# Patient Record
Sex: Female | Born: 1945 | Race: White | Hispanic: No | Marital: Married | Smoking: Never smoker
Health system: Southern US, Community
[De-identification: ages and names within clinical notes are randomized; demographics above are authoritative.]

## PROBLEM LIST (undated history)

## (undated) DIAGNOSIS — C4491 Basal cell carcinoma of skin, unspecified: Secondary | ICD-10-CM

## (undated) DIAGNOSIS — S069XAA Unspecified intracranial injury with loss of consciousness status unknown, initial encounter: Secondary | ICD-10-CM

## (undated) DIAGNOSIS — S069X9A Unspecified intracranial injury with loss of consciousness of unspecified duration, initial encounter: Secondary | ICD-10-CM

## (undated) HISTORY — PX: BREAST CYST EXCISION: SHX579

---

## 2007-01-14 DIAGNOSIS — C4491 Basal cell carcinoma of skin, unspecified: Secondary | ICD-10-CM

## 2007-01-14 HISTORY — DX: Basal cell carcinoma of skin, unspecified: C44.91

## 2018-03-12 ENCOUNTER — Other Ambulatory Visit: Payer: Self-pay

## 2018-03-12 ENCOUNTER — Emergency Department (HOSPITAL_COMMUNITY): Payer: Medicare Other

## 2018-03-12 ENCOUNTER — Encounter (HOSPITAL_COMMUNITY): Payer: Self-pay

## 2018-03-12 ENCOUNTER — Emergency Department (HOSPITAL_COMMUNITY)
Admission: EM | Admit: 2018-03-12 | Discharge: 2018-03-13 | Disposition: A | Discharge status: Home / Self Care | Payer: Medicare Other | Attending: Emergency Medicine | Admitting: Emergency Medicine

## 2018-03-12 DIAGNOSIS — H539 Unspecified visual disturbance: Secondary | ICD-10-CM

## 2018-03-12 DIAGNOSIS — H47292 Other optic atrophy, left eye: Secondary | ICD-10-CM | POA: Insufficient documentation

## 2018-03-12 DIAGNOSIS — H538 Other visual disturbances: Secondary | ICD-10-CM | POA: Diagnosis present

## 2018-03-12 DIAGNOSIS — Z79899 Other long term (current) drug therapy: Secondary | ICD-10-CM | POA: Diagnosis not present

## 2018-03-12 HISTORY — DX: Basal cell carcinoma of skin, unspecified: C44.91

## 2018-03-12 HISTORY — DX: Unspecified intracranial injury with loss of consciousness of unspecified duration, initial encounter: S06.9X9A

## 2018-03-12 HISTORY — DX: Unspecified intracranial injury with loss of consciousness status unknown, initial encounter: S06.9XAA

## 2018-03-12 LAB — COMPREHENSIVE METABOLIC PANEL
ALT: 19 U/L (ref 0–44)
ANION GAP: 12 (ref 5–15)
AST: 20 U/L (ref 15–41)
Albumin: 4.3 g/dL (ref 3.5–5.0)
Alkaline Phosphatase: 53 U/L (ref 38–126)
BUN: 18 mg/dL (ref 8–23)
CO2: 22 mmol/L (ref 22–32)
Calcium: 9.4 mg/dL (ref 8.9–10.3)
Chloride: 104 mmol/L (ref 98–111)
Creatinine, Ser: 0.6 mg/dL (ref 0.44–1.00)
GFR calc Af Amer: >60 mL/min (ref >60)
GFR calc non Af Amer: >60 mL/min (ref >60)
Glucose, Bld: 97 mg/dL (ref 70–99)
Potassium: 3.9 mmol/L (ref 3.5–5.1)
Sodium: 138 mmol/L (ref 135–145)
Total Bilirubin: 0.7 mg/dL (ref 0.3–1.2)
Total Protein: 7.5 g/dL (ref 6.5–8.1)

## 2018-03-12 LAB — CBC WITH DIFFERENTIAL/PLATELET
Abs Immature Granulocytes: 0.03 10*3/uL (ref 0.00–0.07)
BASOS ABS: 0.1 10*3/uL (ref 0.0–0.1)
Basophils Relative: 1 %
Eosinophils Absolute: 0.1 10*3/uL (ref 0.0–0.5)
Eosinophils Relative: 2 %
HCT: 43.6 % (ref 36.0–46.0)
Hemoglobin: 13.9 g/dL (ref 12.0–15.0)
IMMATURE GRANULOCYTES: 1 %
LYMPHS PCT: 38 %
Lymphs Abs: 2.5 10*3/uL (ref 0.7–4.0)
MCH: 29.6 pg (ref 26.0–34.0)
MCHC: 31.9 g/dL (ref 30.0–36.0)
MCV: 93 fL (ref 80.0–100.0)
Monocytes Absolute: 0.8 10*3/uL (ref 0.1–1.0)
Monocytes Relative: 11 %
Neutro Abs: 3.1 10*3/uL (ref 1.7–7.7)
Neutrophils Relative %: 47 %
Platelets: 285 10*3/uL (ref 150–400)
RBC: 4.69 MIL/uL (ref 3.87–5.11)
RDW: 13.1 % (ref 11.5–15.5)
WBC: 6.6 10*3/uL (ref 4.0–10.5)
nRBC: 0 % (ref 0.0–0.2)

## 2018-03-12 LAB — SEDIMENTATION RATE: Sed Rate: 21 mm/hr (ref 0–22)

## 2018-03-12 LAB — C-REACTIVE PROTEIN

## 2018-03-12 MED ORDER — LORAZEPAM 2 MG/ML IJ SOLN
1.0000 mg | Freq: Once | INTRAMUSCULAR | Status: AC
  Administered 2018-03-12: 1 mg via INTRAVENOUS
  Filled 2018-03-12: qty 1

## 2018-03-12 MED ORDER — GADOBUTROL 1 MMOL/ML IV SOLN
5.0000 mL | Freq: Once | INTRAVENOUS | Status: AC | PRN
  Administered 2018-03-12: 5 mL via INTRAVENOUS

## 2018-03-12 MED ORDER — LORAZEPAM 2 MG/ML IJ SOLN
0.5000 mg | Freq: Once | INTRAMUSCULAR | Status: AC
  Administered 2018-03-12: 0.5 mg via INTRAVENOUS
  Filled 2018-03-12: qty 1

## 2018-03-12 NOTE — ED Provider Notes (Signed)
[facility] Provider Note   CSN: [id number] Arrival date & time: 03/12/18  1733    History   Chief Complaint Chief Complaint  Patient presents with  . Eye Problem    HPI Norma Stein is a 73 y.o. female.  She is brought in by her husband for evaluation of an abnormal exam today at her optometrist.  She was getting a routine eye exam because she felt that her vision had diminished somewhat over the last year.  At that time the optometrist did notice that her optic disc on the left was small and pale.  Her visual acuity was still 20/20.  They sent her here to the emergency department to get a stat MRI.  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  She had a minor head injury few days ago when a window fell onto her head.  No loss of consciousness.  She does get migraines at times and she sometimes has some visual symptoms with it.  No current headache no current visual symptoms     The history is provided by the patient.  Eye Problem  Location:  Left eye Quality:  Unable to specify Severity:  Mild Onset quality:  Unable to specify Timing:  Intermittent Progression:  Unchanged Chronicity:  New Worsened by:  Nothing Ineffective treatments:  None tried Associated symptoms: blurred vision (intermittent at times) and headaches   Associated symptoms: no discharge, no double vision, no facial rash, no photophobia, no redness, no swelling and no tearing     Past Medical History:  Diagnosis Date  . Basal cell carcinoma 2009   upper lip  . Traumatic brain injury ([place])     There are no active problems to display for this patient.   History reviewed. No pertinent surgical history.   OB History   No obstetric history on file.      Home Medications    Prior to Admission medications   Medication Sig Start Date End Date Taking? Authorizing Provider    acetaminophen (TYLENOL) 500 MG tablet Take 500-1,000 mg by mouth every 6 (six) hours as needed (for pain or headaches).   Yes [provider]  Ascorbic Acid (VITAMIN C PO) Take 1 tablet by mouth daily with breakfast.   Yes [provider]  Cholecalciferol (VITAMIN D-3 PO) Take 1 capsule by mouth daily with breakfast.   Yes [provider]  COENZYME Q-10 PO Take 1 capsule by mouth at bedtime.   Yes [provider]  Flaxseed, Linseed, (FLAX SEED OIL PO) Take 1 capsule by mouth daily with breakfast.   Yes [provider]  fluticasone (FLONASE) 50 MCG/ACT nasal spray Place 1-2 sprays into both nostrils daily as needed for allergies or rhinitis.  12/31/15  Yes [provider]  Glucos-Chondroit-Hyaluron-MSM (GLUCOSAMINE CHONDROITIN JOINT PO) Take 1 tablet by mouth 2 (two) times daily.   Yes [provider]  liothyronine (CYTOMEL) 25 MCG tablet Take 25 mcg by mouth See admin instructions. Take 25 mcg by mouth 30 minutes prior to breakfast 01/26/18  Yes [provider]  liothyronine (CYTOMEL) 5 MCG tablet Take 5 mcg by mouth See admin instructions. Take 5 mcg by mouth 30 minutes prior to breakfast 03/03/17  Yes [provider]  Melatonin 3 MG TABS Take 3 mg by mouth at bedtime.   Yes [provider]  Omega-3 Fatty Acids (OMEGA-3  PO) Take 1 capsule by mouth at bedtime.   Yes [provider]  SELENIUM PO Take 1 tablet by mouth 2 (two) times daily.   Yes [provider]    Family History History reviewed. No pertinent family history.  Social History Social History   Tobacco Use  . Smoking status: Never Smoker  Substance Use Topics  . Alcohol use: Never    Frequency: Never  . Drug use: Never     Allergies   Propoxyphene; Amoxicillin; Codeine; Erythromycin; Hydrocodone-acetaminophen; Sulfa antibiotics; and Sulfamethoxazole-trimethoprim   Review of Systems Review of Systems   Constitutional: Negative for fever.  HENT: Negative for sore throat.   Eyes: Positive for blurred vision (intermittent at times) and visual disturbance. Negative for double vision, photophobia, pain, discharge and redness.  Respiratory: Negative for shortness of breath.   Cardiovascular: Negative for chest pain.  Gastrointestinal: Negative for abdominal pain.  Genitourinary: Negative for dysuria.  Musculoskeletal: Negative for neck pain.  Skin: Negative for rash.  Neurological: Positive for headaches.     Physical Exam Updated Vital Signs BP (!) 150/64 (BP Location: Right Arm)   Pulse 87   Temp 98 F (36.7 C) (Oral)   Resp 15   SpO2 100%   Physical Exam Vitals signs and nursing note reviewed.  Constitutional:      General: She is not in acute distress.    Appearance: She is well-developed.  HENT:     Head: Normocephalic and atraumatic.  Eyes:     General: Lids are normal.     Extraocular Movements: Extraocular movements intact.     Conjunctiva/sclera: Conjunctivae normal.     Right eye: Right conjunctiva is not injected.     Left eye: Left conjunctiva is not injected.     Pupils: Pupils are equal, round, and reactive to light.     Funduscopic exam:    Right eye: No hemorrhage or exudate.        Left eye: No hemorrhage or exudate.     Slit lamp exam:    Right eye: Anterior chamber quiet.     Left eye: Anterior chamber quiet.  Neck:     Musculoskeletal: Neck supple.  Cardiovascular:     Rate and Rhythm: Normal rate and regular rhythm.     Heart sounds: No murmur.  Pulmonary:     Effort: Pulmonary effort is normal. No respiratory distress.     Breath sounds: Normal breath sounds.  Abdominal:     Palpations: Abdomen is soft.     Tenderness: There is no abdominal tenderness.  Musculoskeletal: Normal range of motion.     Right lower leg: No edema.     Left lower leg: No edema.  Skin:    General: Skin is warm and dry.     Capillary Refill: Capillary refill takes  less than 2 seconds.  Neurological:     General: No focal deficit present.     Mental Status: She is alert and oriented to person, place, and time.     Cranial Nerves: No cranial nerve deficit.     Sensory: No sensory deficit.     Motor: No weakness.     Gait: Gait normal.      ED Treatments / Results  Labs (all labs ordered are listed, but only abnormal results are displayed) Labs Reviewed  COMPREHENSIVE METABOLIC PANEL  CBC WITH DIFFERENTIAL/PLATELET  SEDIMENTATION RATE  C-REACTIVE PROTEIN    EKG None  Radiology Mr Jeri Cos And Wo Contrast  Result Date: 03/13/2018 CLINICAL DATA:  Initial evaluation for left optic nerve pallor. EXAM: MRI HEAD AND ORBITS WITHOUT AND WITH CONTRAST TECHNIQUE: Multiplanar, multiecho pulse sequences of the brain and surrounding structures were obtained without and with intravenous contrast. Multiplanar, multiecho pulse sequences of the orbits and surrounding structures were obtained including fat saturation techniques, before and after intravenous contrast administration. CONTRAST:  5 cc of Gadavist. COMPARISON:  None available. FINDINGS: MRI HEAD FINDINGS Brain: Cerebral volume within normal limits for age. No focal parenchymal signal abnormality identified. No significant cerebral white matter changes. No abnormal foci of restricted diffusion to suggest acute or subacute ischemia. Gray-white matter differentiation well maintained. No encephalomalacia to suggest chronic infarction. No evidence for acute intracranial hemorrhage. Single punctate focus of susceptibility artifact at the anterior left frontal lobe, consistent with a small chronic microhemorrhage, of doubtful significance in isolation. No mass lesion, midline shift or mass effect. No hydrocephalus. No extra-axial fluid collection. No abnormal enhancement within the brain. Pituitary gland suprasellar region normal. Midline structures intact and normal. Vascular: Major intracranial vascular flow  voids maintained. Skull and upper cervical spine: Craniocervical junction normal. Visualized upper cervical spine normal. Bone marrow signal intensity within normal limits. No scalp soft tissue abnormality. Other: Mastoid air cells are clear. Inner ear structures grossly normal. MRI ORBITS FINDINGS Orbits: Globes are symmetric in size with normal appearance and morphology bilaterally. Optic nerves symmetric in size without acute abnormality. No visible lesion or abnormal enhancement along the optic nerve sheaths bilaterally. No abnormality at the orbital apices. Optic chiasm normally situated within the suprasellar cistern. Visible optic radiations normal. No abnormality about the cavernous sinus. Intraconal and extraconal fat well-maintained. Extra-ocular muscles symmetric and normal bilaterally. Superior orbital veins symmetric and normal. Lacrimal glands within normal limits. Visualized sinuses: Mild scattered mucosal thickening within the ethmoidal air cells. Visible sinuses are otherwise clear. Soft tissues: Periorbital soft tissues within normal limits. IMPRESSION: Normal MRI of the brain and orbits for patient age. No acute abnormality identified. No structural findings to explain patient's symptoms. Electronically Signed   By: Jeannine Boga M.D.   On: 03/13/2018 00:28   Mr Rosealee Albee JJ Contrast  Result Date: 03/13/2018 CLINICAL DATA:  Initial evaluation for left optic nerve pallor. EXAM: MRI HEAD AND ORBITS WITHOUT AND WITH CONTRAST TECHNIQUE: Multiplanar, multiecho pulse sequences of the brain and surrounding structures were obtained without and with intravenous contrast. Multiplanar, multiecho pulse sequences of the orbits and surrounding structures were obtained including fat saturation techniques, before and after intravenous contrast administration. CONTRAST:  5 cc of Gadavist. COMPARISON:  None available. FINDINGS: MRI HEAD FINDINGS Brain: Cerebral volume within normal limits for age. No  focal parenchymal signal abnormality identified. No significant cerebral white matter changes. No abnormal foci of restricted diffusion to suggest acute or subacute ischemia. Gray-white matter differentiation well maintained. No encephalomalacia to suggest chronic infarction. No evidence for acute intracranial hemorrhage. Single punctate focus of susceptibility artifact at the anterior left frontal lobe, consistent with a small chronic microhemorrhage, of doubtful significance in isolation. No mass lesion, midline shift or mass effect. No hydrocephalus. No extra-axial fluid collection. No abnormal enhancement within the brain. Pituitary gland suprasellar region normal. Midline structures intact and normal. Vascular: Major intracranial vascular flow voids maintained. Skull and upper cervical spine: Craniocervical junction normal. Visualized upper cervical spine normal. Bone marrow signal intensity within normal limits. No scalp soft tissue abnormality. Other: Mastoid air cells are clear. Inner ear structures grossly normal. MRI ORBITS FINDINGS Orbits: Globes are symmetric  in size with normal appearance and morphology bilaterally. Optic nerves symmetric in size without acute abnormality. No visible lesion or abnormal enhancement along the optic nerve sheaths bilaterally. No abnormality at the orbital apices. Optic chiasm normally situated within the suprasellar cistern. Visible optic radiations normal. No abnormality about the cavernous sinus. Intraconal and extraconal fat well-maintained. Extra-ocular muscles symmetric and normal bilaterally. Superior orbital veins symmetric and normal. Lacrimal glands within normal limits. Visualized sinuses: Mild scattered mucosal thickening within the ethmoidal air cells. Visible sinuses are otherwise clear. Soft tissues: Periorbital soft tissues within normal limits. IMPRESSION: Normal MRI of the brain and orbits for patient age. No acute abnormality identified. No structural  findings to explain patient's symptoms. Electronically Signed   By: Jeannine Boga M.D.   On: 03/13/2018 00:28    Procedures Procedures (including critical care time)  Medications Ordered in ED Medications  LORazepam (ATIVAN) injection 1 mg (1 mg Intravenous Given 03/12/18 2224)  LORazepam (ATIVAN) injection 0.5 mg (0.5 mg Intravenous Given 03/12/18 2246)  gadobutrol (GADAVIST) 1 MMOL/ML injection 5 mL (5 mLs Intravenous Contrast Given 03/12/18 2341)     Initial Impression / Assessment and Plan / ED Course  I have reviewed the triage vital signs and the nursing notes.  Pertinent labs & imaging results that were available during my care of the patient were reviewed by me and considered in my medical decision making (see chart for details).  Clinical Course as of Feb 29 0953  Fri Mar 12, 2018  2053 Patient was seen in the ophthalmologist and I reviewed their note.  Her best vision was 20/20 bilaterally.  They do comment upon a small nerve and unilateral disc edema versus nasal heaping due to a small nerve with oblique insertion.  No pallor no heme.  I reviewed this with Dr. Brigitte Pulse ophthalmology on-call who recommends checking a sed rate and a CRP and getting an MRI with and without of brain and orbits.   [MB]  2228 Sed rate and CRP are unremarkable along with normal labs.  She still pending MRI of her brain and orbits.  Anticipate discharge if no structural lesions to follow-up with her optometrist or Dr. Manuella Ghazi from ophthalmology who was consulted earlier in the day.  He said he could see her on Monday if she needed follow-up.   [MB]  2244 She is at MRI and still feeling claustrophobic, she had received 1 mg of Ativan prior to transfer over there.  I have asked the nurse to go bring her some more Ativan.   [MB]    Clinical Course User Index [MB] Hayden Rasmussen, MD        Final Clinical Impressions(s) / ED Diagnoses   Final diagnoses:  Visual disturbance    ED Discharge  Orders    None       Hayden Rasmussen, MD 03/13/18 [phone]

## 2018-03-12 NOTE — ED Notes (Signed)
Pt in MRI at this time 

## 2018-03-12 NOTE — ED Notes (Signed)
Patient transported to MRI 

## 2018-03-12 NOTE — ED Triage Notes (Signed)
Pt went to routine appointment to update glasses. Pt sent by eye dr after having test done that showed Optic nerve was bright in right eye and left eye optic nerve was dark. Hx of tramatic brain injury. Pt has cognitive issues in right side of brain at baseline. Sent here for MRI of brain and orbits and blood work. Axox4.

## 2018-03-13 NOTE — ED Provider Notes (Signed)
I assumed care in signout to follow-up on MRI imaging.  All imaging is negative.  Patient feels at baseline.  Will discharge home   Ripley Fraise, MD 03/13/18 [phone]

## 2018-03-13 NOTE — ED Notes (Signed)
Reviewed d/c instructions with pt, who verbalized understanding and had no outstanding questions. Pt departed in NAD.   

## 2019-02-22 ENCOUNTER — Other Ambulatory Visit: Payer: Self-pay | Admitting: Family Medicine

## 2019-02-22 DIAGNOSIS — E2839 Other primary ovarian failure: Secondary | ICD-10-CM

## 2019-02-22 DIAGNOSIS — E042 Nontoxic multinodular goiter: Secondary | ICD-10-CM

## 2019-02-22 DIAGNOSIS — Z1231 Encounter for screening mammogram for malignant neoplasm of breast: Secondary | ICD-10-CM

## 2019-05-09 ENCOUNTER — Other Ambulatory Visit: Payer: Self-pay | Admitting: Family Medicine

## 2019-05-09 DIAGNOSIS — Z13820 Encounter for screening for osteoporosis: Secondary | ICD-10-CM

## 2019-05-09 DIAGNOSIS — Z780 Asymptomatic menopausal state: Secondary | ICD-10-CM

## 2019-05-11 ENCOUNTER — Ambulatory Visit
Admission: RE | Admit: 2019-05-11 | Discharge: 2019-05-11 | Disposition: A | Discharge status: Home / Self Care | Payer: Medicare Other | Source: Ambulatory Visit | Attending: Family Medicine | Admitting: Family Medicine

## 2019-05-11 ENCOUNTER — Other Ambulatory Visit: Payer: Self-pay

## 2019-05-11 DIAGNOSIS — Z1231 Encounter for screening mammogram for malignant neoplasm of breast: Secondary | ICD-10-CM

## 2019-05-11 DIAGNOSIS — E042 Nontoxic multinodular goiter: Secondary | ICD-10-CM

## 2019-05-11 DIAGNOSIS — Z13820 Encounter for screening for osteoporosis: Secondary | ICD-10-CM

## 2019-05-11 DIAGNOSIS — Z780 Asymptomatic menopausal state: Secondary | ICD-10-CM

## 2019-05-12 ENCOUNTER — Other Ambulatory Visit: Payer: Self-pay | Admitting: Family Medicine

## 2019-05-12 DIAGNOSIS — R928 Other abnormal and inconclusive findings on diagnostic imaging of breast: Secondary | ICD-10-CM

## 2019-05-27 ENCOUNTER — Other Ambulatory Visit: Payer: Medicare Other

## 2019-07-20 ENCOUNTER — Other Ambulatory Visit: Payer: Medicare Other

## 2020-01-29 IMAGING — MR MR HEAD WO/W CM
17 of 21 series · 32 of 48 positions shown · IV contrast (Gadavist)
Comparison: None available.

CLINICAL DATA: Initial evaluation for left optic nerve pallor.

EXAM:
MRI HEAD AND ORBITS WITHOUT AND WITH CONTRAST
TECHNIQUE: Multiplanar, multiecho pulse sequences of the brain and surrounding
structures were obtained without and with intravenous contrast.
Multiplanar, multiecho pulse sequences of the orbits and surrounding
structures were obtained including fat saturation techniques, before
and after intravenous contrast administration.
CONTRAST:  5 cc of Gadavist.

[Series 5: DWI · axial · 3.0mm · 0.88mm/px · z∈[-63,+72]mm · 5 of 92 slices shown (1 of 4)]
[im 1/92]
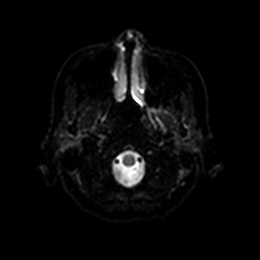
[im 23/92]
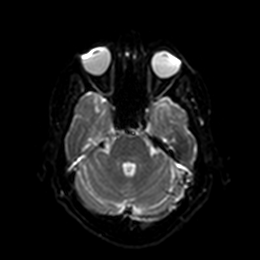
[im 46/92]
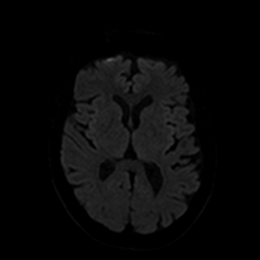
[im 69/92]
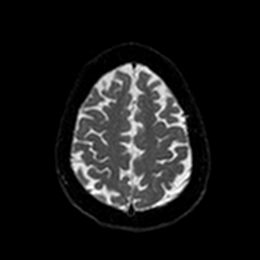
[im 92/92]
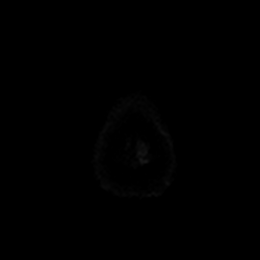

[Series 6: DWI · axial · 3.0mm · 0.88mm/px · z∈[-63,+72]mm · 2 of 46 slices shown (2 of 4)]
[im 1/46]
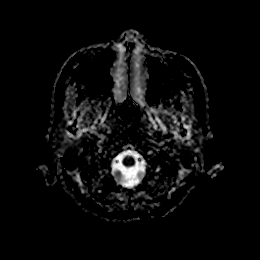
[im 46/46]
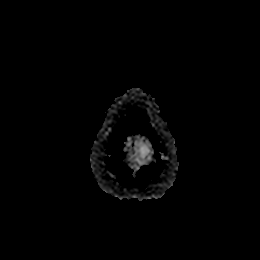

[Series 7: DWI · coronal · 4.0mm · 0.88mm/px · 3 of 68 slices shown (3 of 4)]
[im 1/68]
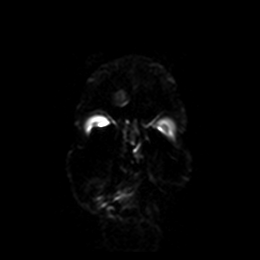
[im 34/68]
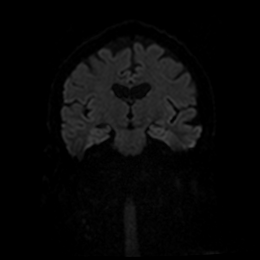
[im 68/68]
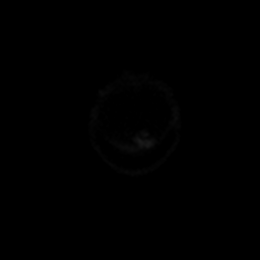

[Series 8: DWI · coronal · 4.0mm · 0.88mm/px · 2 of 34 slices shown (4 of 4)]
[im 1/34]
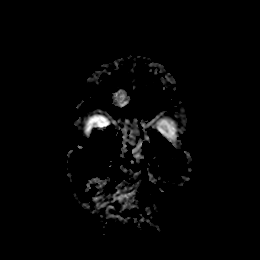
[im 34/34]
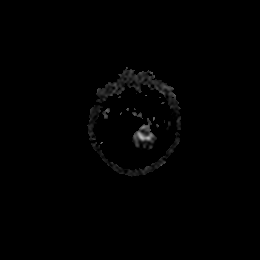

[Series 9: T1 · sagittal · 5.0mm · 0.75mm/px · 1 of 23 slices shown (1 of 3)]
[im 1/23]
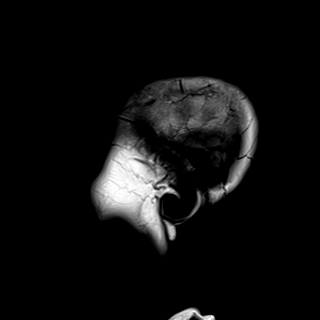

[Series 10: T2 · axial · 5.0mm · 0.72mm/px · z∈[-71,+72]mm · 2 of 25 slices shown]
[im 1/25]
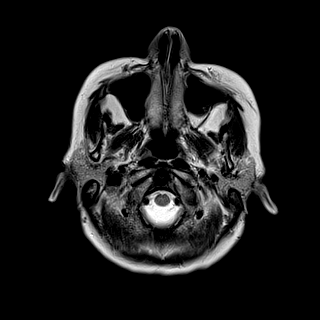
[im 25/25]
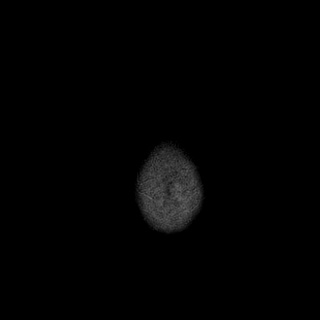

[Series 11: FLAIR · axial · 5.0mm · 0.45mm/px · z∈[-73,+70]mm · 2 of 25 slices shown]
[im 1/25]
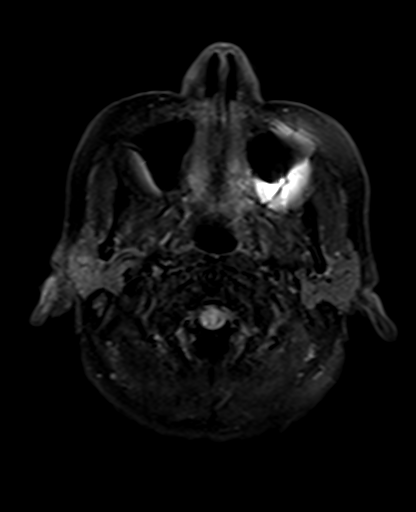
[im 25/25]
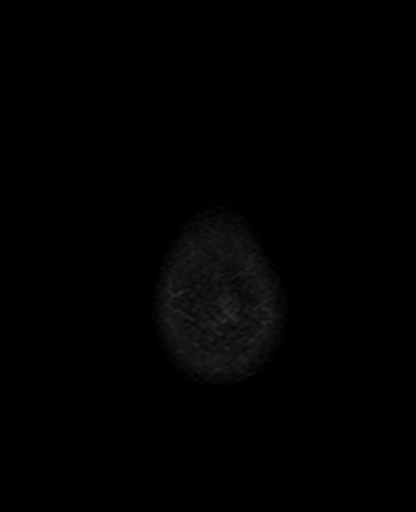

[Series 17: T1 · axial · non-contrast · 3.0mm · 0.37mm/px · 1 of 20 slices shown (2 of 3)]
[im 1/20]
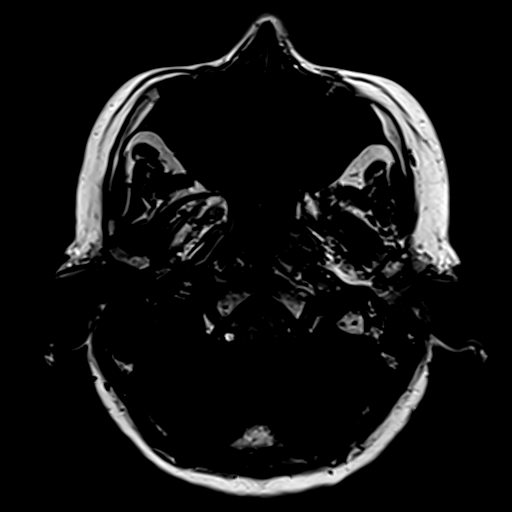

[Series 20: T2 fat-sat · axial · 3.0mm · 0.54mm/px · 1 of 20 slices shown (1 of 3)]
[im 1/20]
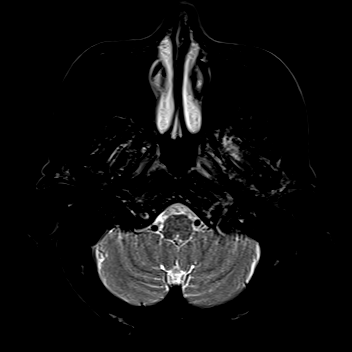

[Series 23: T2 fat-sat · coronal · 3.0mm · 0.54mm/px · 2 of 25 slices shown (2 of 3)]
[im 1/25]
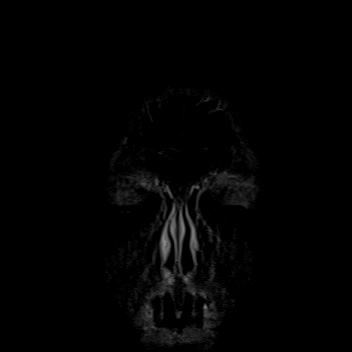
[im 25/25]
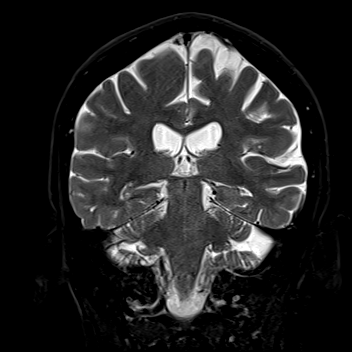

[Series 24: T1 · coronal · 3.0mm · 0.37mm/px · 2 of 25 slices shown (3 of 3)]
[im 1/25]
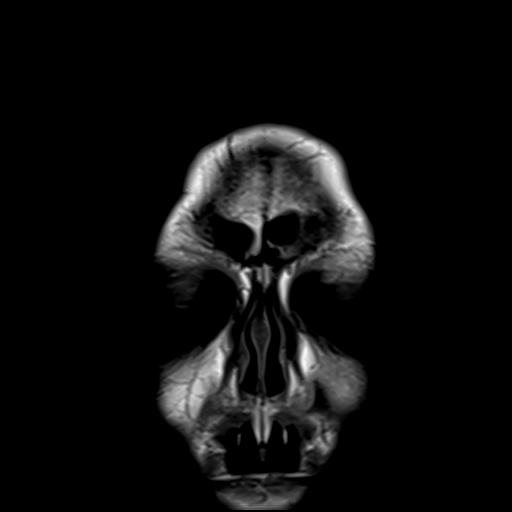
[im 25/25]
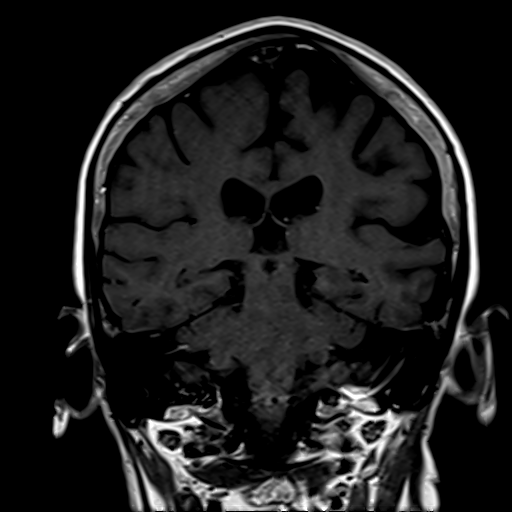

[Series 27: T2 fat-sat · axial · 3.0mm · 0.54mm/px · z∈[-46,+54]mm · 2 of 25 slices shown (3 of 3)]
[im 1/25]
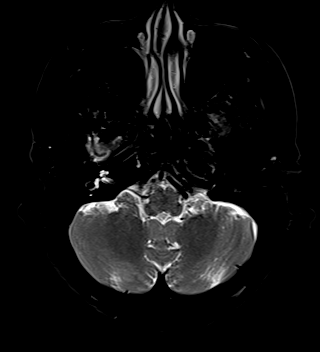
[im 25/25]
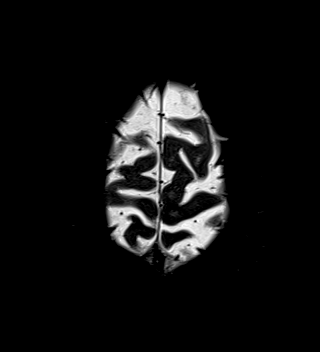

[Series 28: T2 post-contrast · coronal · 5.0mm · 0.72mm/px · 2 of 29 slices shown]
[im 1/29]
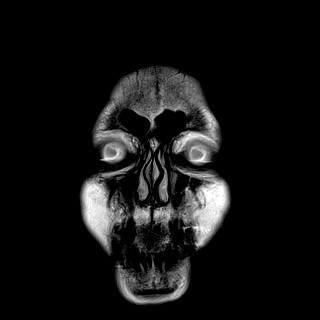
[im 29/29]
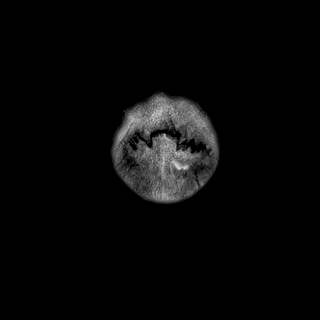

[Series 30: T1 fat-sat post-contrast · axial · 3.0mm · 0.37mm/px · 1 of 20 slices shown (1 of 2)]
[im 1/20]
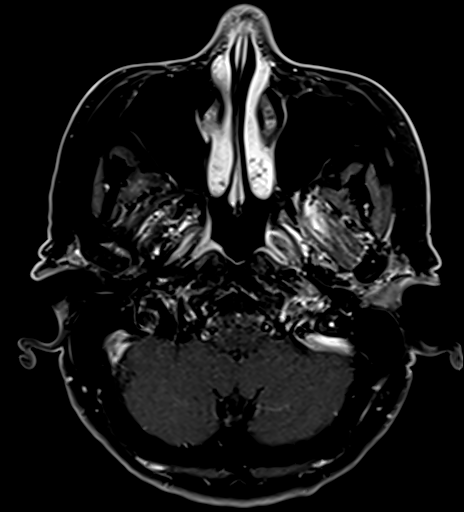

[Series 31: T1 fat-sat post-contrast · coronal · 3.0mm · 0.37mm/px · 1 of 25 slices shown (2 of 2)]
[im 1/25]
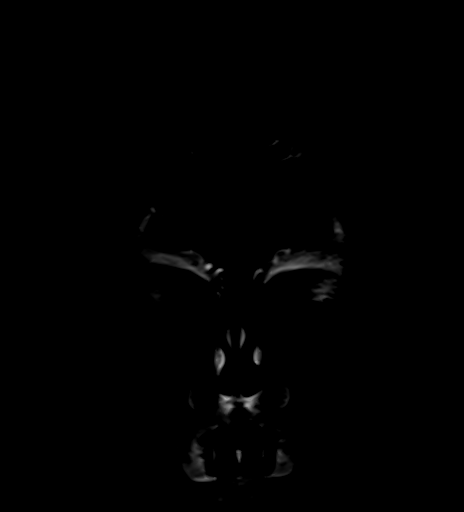

[Series 32: T1 post-contrast · coronal · 5.0mm · 0.34mm/px · 2 of 29 slices shown (1 of 2)]
[im 1/29]
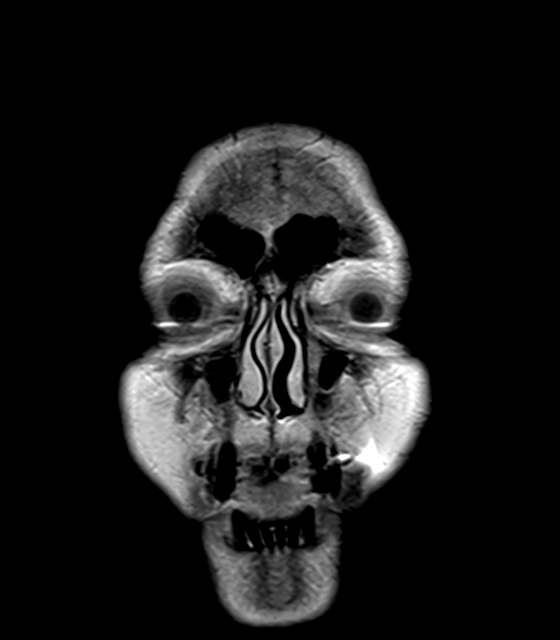
[im 29/29]
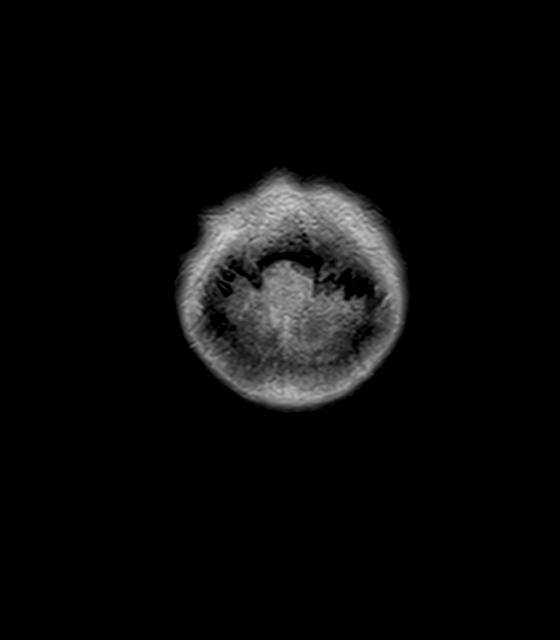

[Series 33: T1 post-contrast · sagittal · 5.0mm · 0.75mm/px · 1 of 23 slices shown (2 of 2)]
[im 1/23]
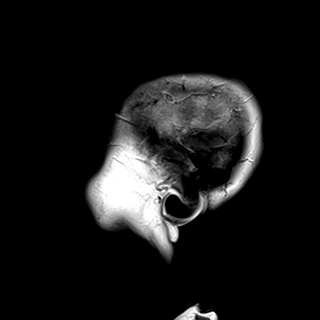

[32 of 48 positions shown; findings below may reference images not displayed]

FINDINGS: MRI HEAD FINDINGS

Brain: Cerebral volume within normal limits for age. No focal
parenchymal signal abnormality identified. No significant cerebral
white matter changes. No abnormal foci of restricted diffusion to
suggest acute or subacute ischemia. Gray-white matter
differentiation well maintained. No encephalomalacia to suggest
chronic infarction. No evidence for acute intracranial hemorrhage.
Single punctate focus of susceptibility artifact at the anterior
left frontal lobe, consistent with a small chronic microhemorrhage,
of doubtful significance in isolation.

No mass lesion, midline shift or mass effect. No hydrocephalus. No
extra-axial fluid collection. No abnormal enhancement within the
brain.

Pituitary gland suprasellar region normal. Midline structures intact
and normal.

Vascular: Major intracranial vascular flow voids maintained.

Skull and upper cervical spine: Craniocervical junction normal.
Visualized upper cervical spine normal. Bone marrow signal intensity
within normal limits. No scalp soft tissue abnormality.

Other: Mastoid air cells are clear. Inner ear structures grossly
normal.

MRI ORBITS FINDINGS

Orbits: Globes are symmetric in size with normal appearance and
morphology bilaterally. Optic nerves symmetric in size without acute
abnormality. No visible lesion or abnormal enhancement along the
optic nerve sheaths bilaterally. No abnormality at the orbital
apices. Optic chiasm normally situated within the suprasellar
cistern. Visible optic radiations normal. No abnormality about the
cavernous sinus. Intraconal and extraconal fat well-maintained.
Extra-ocular muscles symmetric and normal bilaterally. Superior
orbital veins symmetric and normal. Lacrimal glands within normal
limits.

Visualized sinuses: Mild scattered mucosal thickening within the
ethmoidal air cells. Visible sinuses are otherwise clear.

Soft tissues: Periorbital soft tissues within normal limits.
IMPRESSION: Normal MRI of the brain and orbits for patient age. No acute
abnormality identified. No structural findings to explain patient's
symptoms.
